# Patient Record
Sex: Male | Born: 2000
Health system: Southern US, Community
[De-identification: ages and names within clinical notes are randomized; demographics above are authoritative.]

## PROBLEM LIST (undated history)

## (undated) DIAGNOSIS — M419 Scoliosis, unspecified: Secondary | ICD-10-CM

## (undated) DIAGNOSIS — J302 Other seasonal allergic rhinitis: Secondary | ICD-10-CM

## (undated) HISTORY — DX: Other seasonal allergic rhinitis: J30.2

## (undated) HISTORY — DX: Scoliosis, unspecified: M41.9

---
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
## 2021-03-03 ENCOUNTER — Ambulatory Visit: Payer: BC Managed Care – PPO | Admitting: Family Medicine

## 2021-03-03 ENCOUNTER — Other Ambulatory Visit: Payer: Self-pay

## 2021-03-03 ENCOUNTER — Encounter: Payer: Self-pay | Admitting: Family Medicine

## 2021-03-03 VITALS — BP 124/64 | HR 77 | Temp 98.2°F | Resp 17 | Ht 65.0 in | Wt 160.0 lb

## 2021-03-03 DIAGNOSIS — L03119 Cellulitis of unspecified part of limb: Secondary | ICD-10-CM

## 2021-03-03 DIAGNOSIS — L02419 Cutaneous abscess of limb, unspecified: Secondary | ICD-10-CM | POA: Diagnosis not present

## 2021-03-03 MED ORDER — DOXYCYCLINE HYCLATE 100 MG PO TABS: 100.0000 mg | ORAL_TABLET | Freq: Two times a day (BID) | ORAL | 0 refills | Status: AC

## 2021-03-03 NOTE — Progress Notes (Signed)
Chief Complaint  Patient presents with  . Skin Abcess    Right arm, redness, swelling    David Price is a 20 y.o. male here for a skin complaint. Here w mom.   Duration: 1 week Location: R upper arm Pruritic? Yes Painful? Yes Drainage? Yes New soaps/lotions/topicals/detergents? No Other associated symptoms: redness Therapies tried thus far: peroxide  Past Medical History:  Diagnosis Date  . Scoliosis    mild  . Seasonal allergies     BP 124/64 (BP Location: Left Arm, Patient Position: Sitting, Cuff Size: Normal)   Pulse 77   Temp 98.2 F (36.8 C)   Resp 17   Ht 5\' 5"  (1.651 m)   Wt 160 lb (72.6 kg)   SpO2 99%   BMI 26.63 kg/m  Gen: awake, alert, appearing stated age Lungs: No accessory muscle use Skin: Over the distal right upper extremity, there is a circular area of erythema measuring approximately 6 cm with a central area of fluctuance and induration with a pustule centrally located.  There is warmth and tenderness to palpation.  I was able to manually express several milliliters of purulent material. Psych: Age appropriate judgment and insight  Cellulitis and abscess of upper extremity - Plan: doxycycline (VIBRA-TABS) 100 MG tablet  I think he has a large opening where this can drain naturally and we can treat with oral antibiotics for the surrounding cellulitis.  He will keep the area clean and dry. Warning signs and symptoms verbalized and written down in AVS. follow-up next week if symptoms worsen or fail to improve. The patient and his mother voiced understanding and agreement to the plan.  [place], DO 03/03/21 4:44 PM

## 2021-03-03 NOTE — Patient Instructions (Addendum)
When you do wash it, use only soap and water. Do not vigorously scrub.  Keep the area clean and dry.   Things to look out for: increasing pain not relieved by ibuprofen/acetaminophen, fevers, spreading redness, or foul odor.  If you get recurrent infections, consider a bleach bath:  To prepare a bleach bath, one-fourth to one-half cup of bleach is placed in a full bathtub (about 40 gallons) of water. Bleach baths are usually taken for 5 to 10 minutes twice per week and should be followed by application of an emollient.  Let us know if you need anything.

## 2021-12-11 ENCOUNTER — Encounter: Payer: BC Managed Care – PPO | Admitting: Family Medicine

## 2022-05-20 ENCOUNTER — Other Ambulatory Visit: Payer: Self-pay

## 2022-05-20 ENCOUNTER — Emergency Department (HOSPITAL_COMMUNITY)

## 2022-05-20 ENCOUNTER — Encounter (HOSPITAL_COMMUNITY): Payer: Self-pay

## 2022-05-20 ENCOUNTER — Emergency Department (HOSPITAL_COMMUNITY)
Admission: EM | Admit: 2022-05-20 | Discharge: 2022-05-20 | Disposition: A | Discharge status: Home / Self Care | Attending: Emergency Medicine | Admitting: Emergency Medicine

## 2022-05-20 DIAGNOSIS — R140 Abdominal distension (gaseous): Secondary | ICD-10-CM | POA: Diagnosis not present

## 2022-05-20 DIAGNOSIS — R1084 Generalized abdominal pain: Secondary | ICD-10-CM | POA: Diagnosis not present

## 2022-05-20 DIAGNOSIS — R109 Unspecified abdominal pain: Secondary | ICD-10-CM | POA: Diagnosis present

## 2022-05-20 LAB — BASIC METABOLIC PANEL
Anion gap: 7 (ref 5–15)
BUN: 12 mg/dL (ref 6–20)
CO2: 26 mmol/L (ref 22–32)
Calcium: 9.1 mg/dL (ref 8.9–10.3)
Chloride: 106 mmol/L (ref 98–111)
Creatinine, Ser: 0.95 mg/dL (ref 0.61–1.24)
GFR, Estimated: >60 mL/min (ref >60)
Glucose, Bld: 118 mg/dL — ABNORMAL HIGH (ref 70–99)
Potassium: 3.5 mmol/L (ref 3.5–5.1)
Sodium: 139 mmol/L (ref 135–145)

## 2022-05-20 LAB — CBC
HCT: 44.9 % (ref 39.0–52.0)
Hemoglobin: 15.9 g/dL (ref 13.0–17.0)
MCH: 30.9 pg (ref 26.0–34.0)
MCHC: 35.4 g/dL (ref 30.0–36.0)
MCV: 87.4 fL (ref 80.0–100.0)
Platelets: 226 10*3/uL (ref 150–400)
RBC: 5.14 MIL/uL (ref 4.22–5.81)
RDW: 11.9 % (ref 11.5–15.5)
WBC: 11.5 10*3/uL — ABNORMAL HIGH (ref 4.0–10.5)
nRBC: 0 % (ref 0.0–0.2)

## 2022-05-20 MED ORDER — HYOSCYAMINE SULFATE 0.125 MG SL SUBL: 0.1250 mg | SUBLINGUAL_TABLET | SUBLINGUAL | 0 refills | Status: AC | PRN

## 2022-05-20 MED ORDER — HYOSCYAMINE SULFATE 0.125 MG PO TBDP
0.2500 mg | ORAL_TABLET | Freq: Once | ORAL | Status: AC
  Administered 2022-05-20: 0.25 mg via ORAL
  Filled 2022-05-20 (×2): qty 2

## 2022-05-20 MED ORDER — BARIUM SULFATE 0.1 % PO SUSP
ORAL | Status: AC
  Administered 2022-05-20: 900 mL via ORAL
  Filled 2022-05-20: qty 3

## 2022-05-20 MED ORDER — SIMETHICONE 40 MG/0.6ML PO SUSP
40.0000 mg | Freq: Once | ORAL | Status: AC
  Administered 2022-05-20: 40 mg via ORAL
  Filled 2022-05-20: qty 0.6

## 2022-05-20 MED ORDER — SODIUM CHLORIDE (PF) 0.9 % IJ SOLN
INTRAMUSCULAR | Status: AC
  Filled 2022-05-20: qty 50

## 2022-05-20 MED ORDER — IOHEXOL 300 MG/ML  SOLN
100.0000 mL | Freq: Once | INTRAMUSCULAR | Status: AC | PRN
  Administered 2022-05-20: 100 mL via INTRAVENOUS

## 2022-05-20 MED ORDER — BISACODYL 5 MG PO TBEC: 5.0000 mg | DELAYED_RELEASE_TABLET | Freq: Every day | ORAL | 0 refills | Status: AC | PRN

## 2022-05-20 MED ORDER — KETOROLAC TROMETHAMINE 30 MG/ML IJ SOLN
15.0000 mg | Freq: Once | INTRAMUSCULAR | Status: AC
  Administered 2022-05-20: 15 mg via INTRAVENOUS
  Filled 2022-05-20: qty 1
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
## 2023-10-08 ENCOUNTER — Telehealth: Payer: Self-pay

## 2023-10-08 NOTE — Telephone Encounter (Signed)
LVM for call back

## 2023-12-23 IMAGING — CT CT ENTEROGRAPHY (ABD-PELV W/ CM)
2 of 5 series · 16 of 46 positions shown, 18 images · IV contrast (APPLIED)
Comparison: Two days ago

CLINICAL DATA: Flank pain with constant patient and distension.
History of intussusception by CT recently

EXAM:
CT ABDOMEN AND PELVIS WITH CONTRAST (ENTEROGRAPHY)
TECHNIQUE: Multidetector CT of the abdomen and pelvis during bolus
administration of intravenous contrast. Negative oral contrast was
given.

[Series 3: entero thins · axial · 0.81mm/px · z∈[+986,+1410]mm · 13 of 242 slices shown, 15 images]
[im 15/242  soft-tissue]
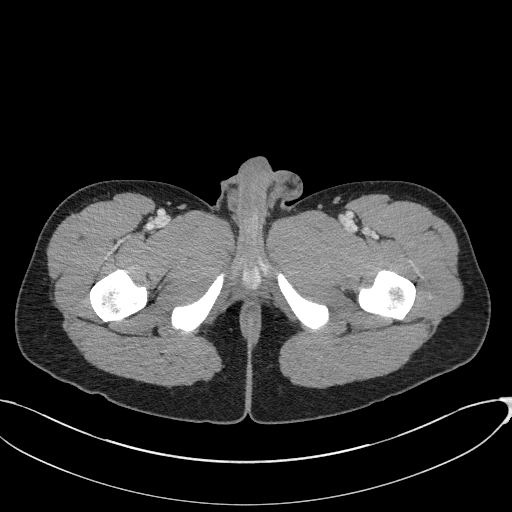
[im 15/242  bone]
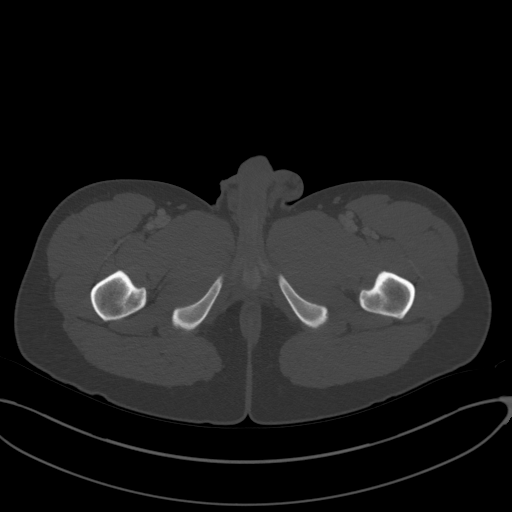
[im 29/242  soft-tissue]
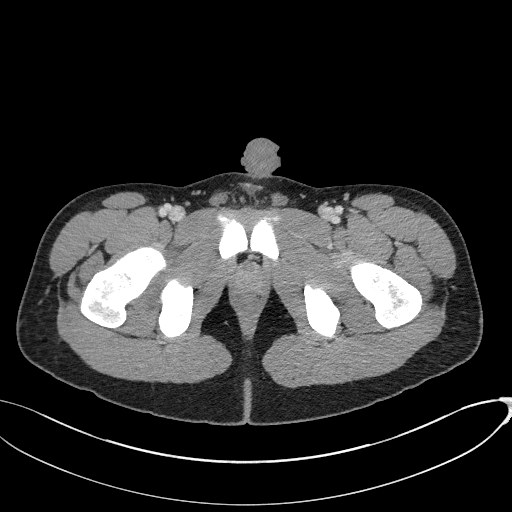
[im 57/242  soft-tissue]
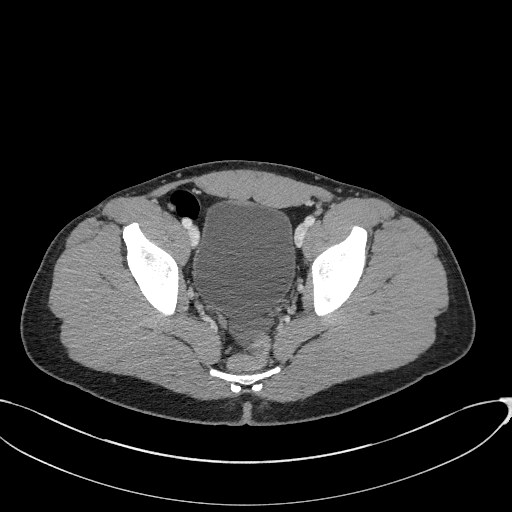
[im 71/242  soft-tissue]
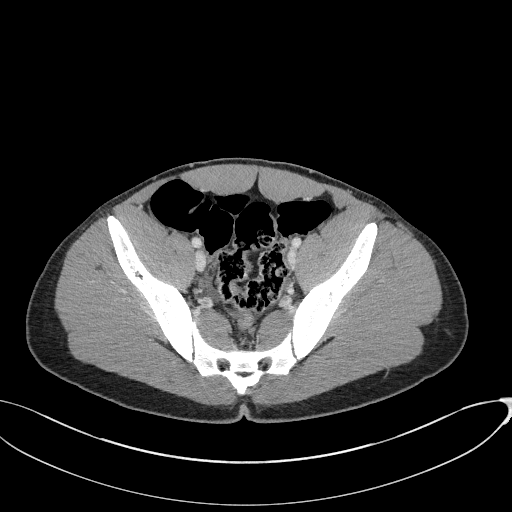
[im 86/242  soft-tissue]
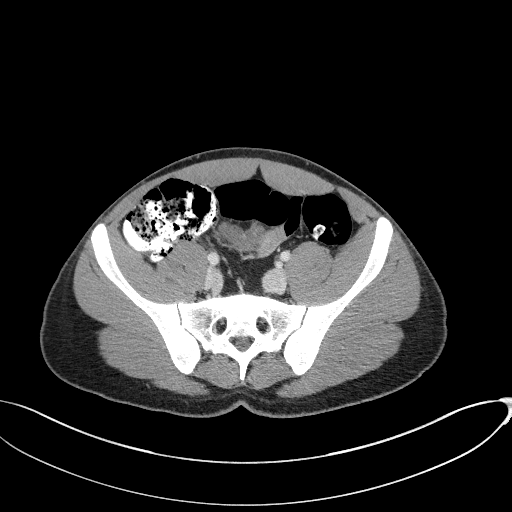
[im 100/242  soft-tissue]
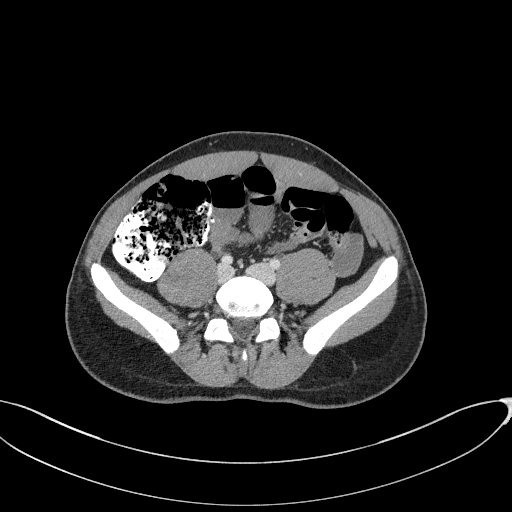
[im 128/242  soft-tissue]
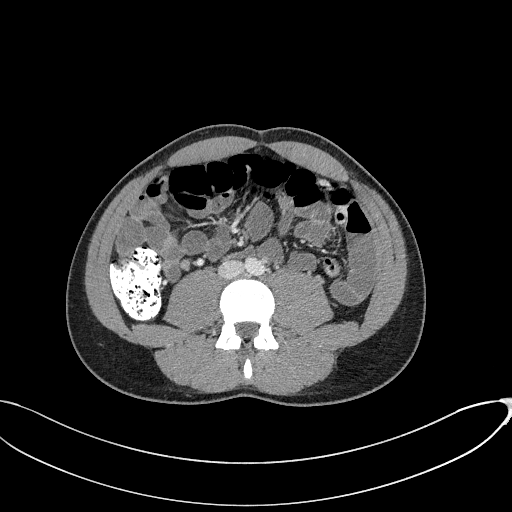
[im 142/242  soft-tissue]
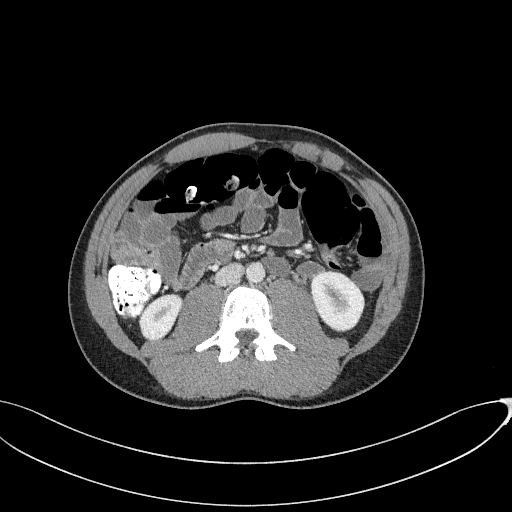
[im 156/242  soft-tissue]
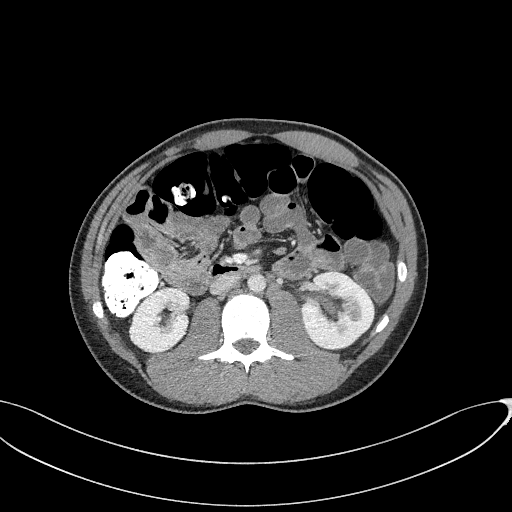
[im 156/242  bone]
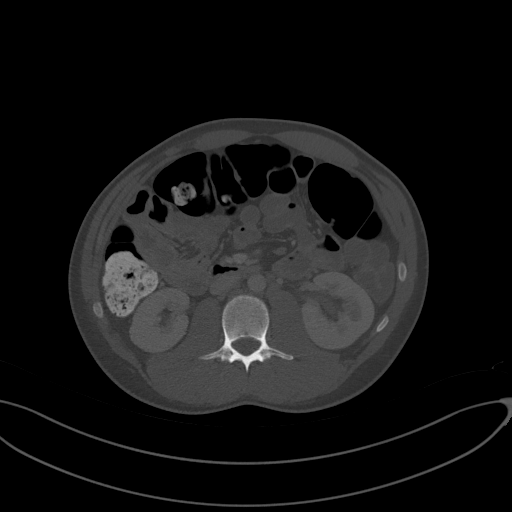
[im 171/242  soft-tissue]
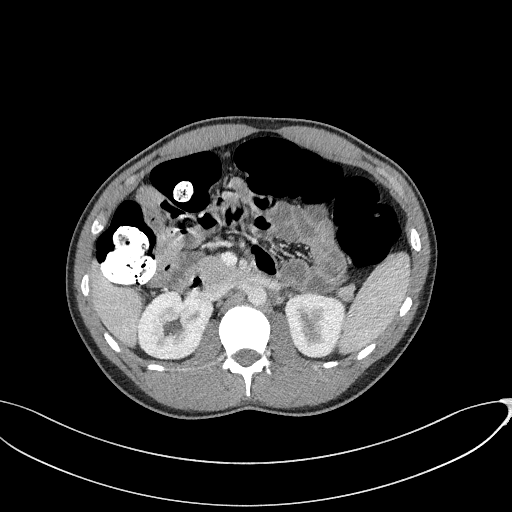
[im 185/242  soft-tissue]
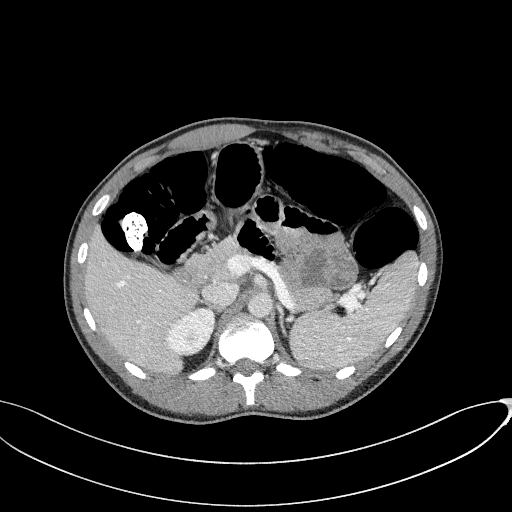
[im 213/242  soft-tissue]
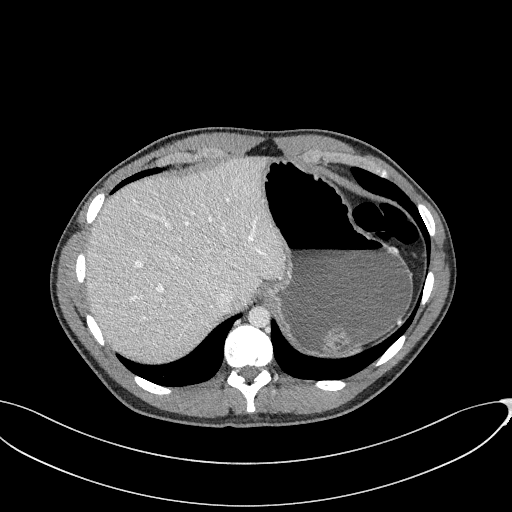
[im 227/242  soft-tissue]
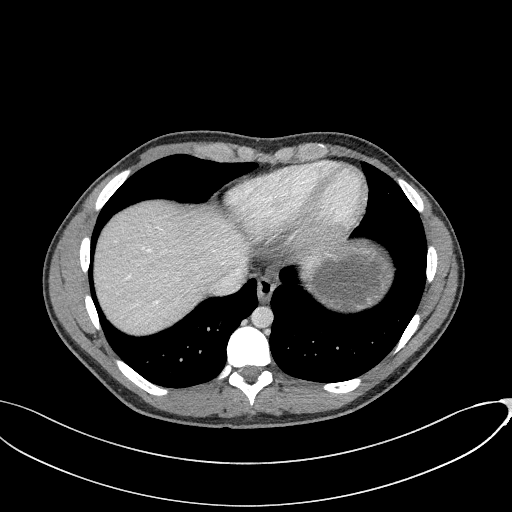

[Series 6: coronal · coronal · 0.77mm/px · 3 of 85 slices shown]
[im 29/85  soft-tissue]
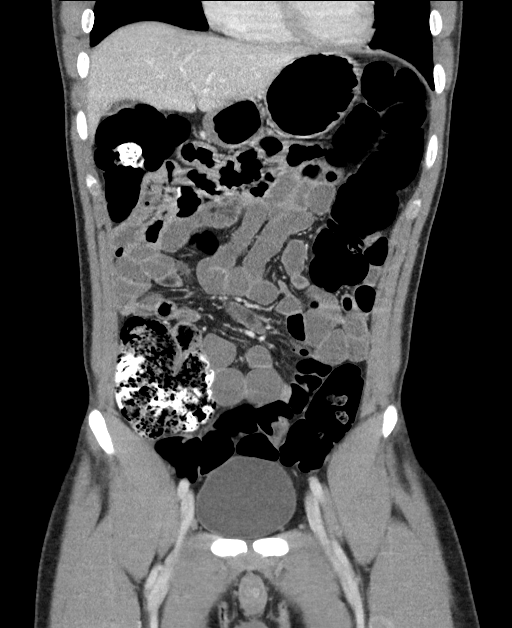
[im 38/85  soft-tissue]
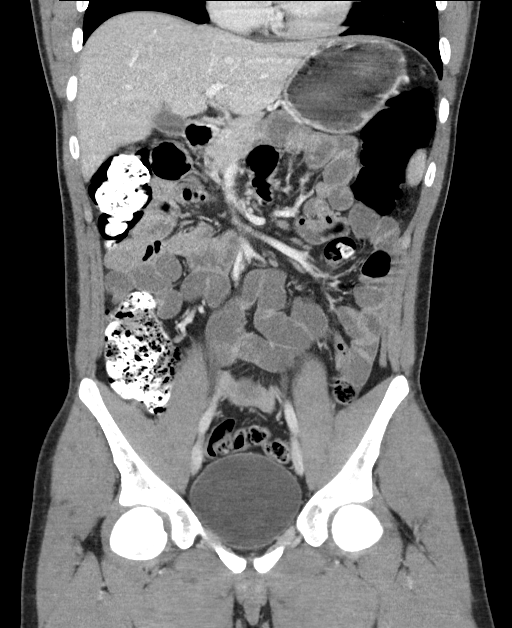
[im 47/85  soft-tissue]
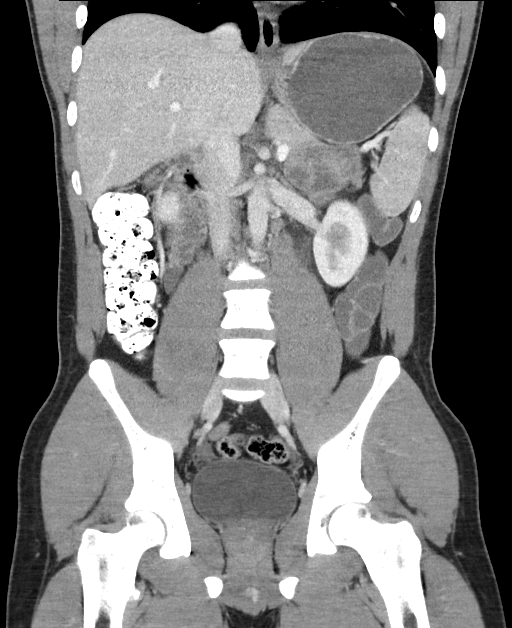

[16 of 46 positions shown; findings below may reference images not displayed]

RADIATION DOSE REDUCTION: This exam was performed according to the
departmental dose-optimization program which includes automated
exposure control, adjustment of the mA and/or kV according to
patient size and/or use of iterative reconstruction technique.

CONTRAST:  100mL OMNIPAQUE IOHEXOL 300 MG/ML  SOLN
FINDINGS: Lower chest:  No contributory findings.

Hepatobiliary: No focal liver abnormality.No evidence of biliary
obstruction or stone.

Pancreas: Unremarkable.

Spleen: Unremarkable.

Adrenals/Urinary Tract: Negative adrenals. No hydronephrosis or
stone. Unremarkable bladder.

Stomach/Bowel: Fluid-filled stomach and small bowel attributed to
oral negative contrast administration. No visible stomach or small
bowel inflammation or mass. No intussusception seen again. The colon
contains enteric contrast from recent study and is gas distended to
the level of the splenic flexure. No colonic wall thickening or
inflammation is seen. The appendix is difficult to visualize
separate from adjacent small bowel loops. No secondary signs of
appendicitis

Vascular/Lymphatic: No acute vascular abnormality. No mass or
adenopathy.

Reproductive:No pathologic findings.

Other: No ascites or pneumoperitoneum.

Musculoskeletal: No acute abnormalities.
IMPRESSION: 1. Negative for intussusception or visible small bowel inflammation.
2. Oral contrast from 2 days ago is seen in the proximal colon.
There is gaseous colonic distension without obstructive pattern,
which may relate to the history of distension.
3. Trace pelvic fluid without visible source.
